# Patient Record
Sex: Male | Born: 1965
Health system: Southern US, Community
[De-identification: ages and names within clinical notes are randomized; demographics above are authoritative.]

## PROBLEM LIST (undated history)

## (undated) DIAGNOSIS — I2510 Atherosclerotic heart disease of native coronary artery without angina pectoris: Secondary | ICD-10-CM

## (undated) DIAGNOSIS — E7800 Pure hypercholesterolemia, unspecified: Secondary | ICD-10-CM

## (undated) HISTORY — DX: Pure hypercholesterolemia, unspecified: E78.00

## (undated) HISTORY — DX: Atherosclerotic heart disease of native coronary artery without angina pectoris: I25.10

---

## 2016-12-10 DIAGNOSIS — Z0000 Encounter for general adult medical examination without abnormal findings: Secondary | ICD-10-CM | POA: Diagnosis not present

## 2016-12-10 DIAGNOSIS — Z136 Encounter for screening for cardiovascular disorders: Secondary | ICD-10-CM | POA: Diagnosis not present

## 2017-04-26 DIAGNOSIS — B349 Viral infection, unspecified: Secondary | ICD-10-CM | POA: Diagnosis not present

## 2017-04-26 DIAGNOSIS — R509 Fever, unspecified: Secondary | ICD-10-CM | POA: Diagnosis not present

## 2018-01-07 DIAGNOSIS — Z136 Encounter for screening for cardiovascular disorders: Secondary | ICD-10-CM | POA: Diagnosis not present

## 2018-01-07 DIAGNOSIS — Z0000 Encounter for general adult medical examination without abnormal findings: Secondary | ICD-10-CM | POA: Diagnosis not present

## 2019-12-28 DIAGNOSIS — H52223 Regular astigmatism, bilateral: Secondary | ICD-10-CM | POA: Diagnosis not present

## 2019-12-28 DIAGNOSIS — H5203 Hypermetropia, bilateral: Secondary | ICD-10-CM | POA: Diagnosis not present

## 2019-12-28 DIAGNOSIS — H524 Presbyopia: Secondary | ICD-10-CM | POA: Diagnosis not present

## 2020-02-02 DIAGNOSIS — Z125 Encounter for screening for malignant neoplasm of prostate: Secondary | ICD-10-CM | POA: Diagnosis not present

## 2020-02-02 DIAGNOSIS — Z0000 Encounter for general adult medical examination without abnormal findings: Secondary | ICD-10-CM | POA: Diagnosis not present

## 2020-02-02 DIAGNOSIS — Z23 Encounter for immunization: Secondary | ICD-10-CM | POA: Diagnosis not present

## 2020-02-02 DIAGNOSIS — R7309 Other abnormal glucose: Secondary | ICD-10-CM | POA: Diagnosis not present

## 2020-02-02 DIAGNOSIS — E7800 Pure hypercholesterolemia, unspecified: Secondary | ICD-10-CM | POA: Diagnosis not present

## 2020-05-06 DIAGNOSIS — Z23 Encounter for immunization: Secondary | ICD-10-CM | POA: Diagnosis not present

## 2020-09-05 ENCOUNTER — Other Ambulatory Visit (HOSPITAL_BASED_OUTPATIENT_CLINIC_OR_DEPARTMENT_OTHER): Payer: Self-pay

## 2020-09-05 DIAGNOSIS — M7522 Bicipital tendinitis, left shoulder: Secondary | ICD-10-CM | POA: Diagnosis not present

## 2020-09-05 MED ORDER — METHYLPREDNISOLONE 4 MG PO TBPK
ORAL_TABLET | ORAL | 0 refills | Status: DC
  Filled 2020-09-05: qty 21, 6d supply, fill #0

## 2020-09-05 MED ORDER — HYDROCODONE-ACETAMINOPHEN 5-325 MG PO TABS
ORAL_TABLET | ORAL | 0 refills | Status: DC
  Filled 2020-09-05: qty 3, 3d supply, fill #0

## 2020-09-06 ENCOUNTER — Other Ambulatory Visit (HOSPITAL_COMMUNITY): Payer: Self-pay

## 2020-09-06 ENCOUNTER — Other Ambulatory Visit (HOSPITAL_BASED_OUTPATIENT_CLINIC_OR_DEPARTMENT_OTHER): Payer: Self-pay

## 2020-12-11 ENCOUNTER — Ambulatory Visit: Payer: Self-pay | Attending: Internal Medicine

## 2020-12-11 DIAGNOSIS — Z23 Encounter for immunization: Secondary | ICD-10-CM

## 2020-12-11 NOTE — Progress Notes (Signed)
   Covid-19 Vaccination Clinic  Name:  Philip Lamb    MRN: [id number] DOB: Nov 06, 1965  12/11/2020  Mr. Philip Lamb was observed post Covid-19 immunization for 15 minutes without incident. He was provided with Vaccine Information Sheet and instruction to access the V-Safe system.   Mr. Philip Lamb was instructed to call 911 with any severe reactions post vaccine: Difficulty breathing  Swelling of face and throat  A fast heartbeat  A bad rash all over body  Dizziness and weakness

## 2021-01-07 ENCOUNTER — Other Ambulatory Visit (HOSPITAL_BASED_OUTPATIENT_CLINIC_OR_DEPARTMENT_OTHER): Payer: Self-pay

## 2021-01-07 MED ORDER — PFIZER COVID-19 VAC BIVALENT 30 MCG/0.3ML IM SUSP
INTRAMUSCULAR | 0 refills | Status: DC
  Filled 2021-01-07: qty 0.3, 1d supply, fill #0

## 2021-02-06 ENCOUNTER — Other Ambulatory Visit: Payer: Self-pay | Admitting: Family Medicine

## 2021-02-06 DIAGNOSIS — E7800 Pure hypercholesterolemia, unspecified: Secondary | ICD-10-CM | POA: Diagnosis not present

## 2021-02-06 DIAGNOSIS — Z13220 Encounter for screening for lipoid disorders: Secondary | ICD-10-CM | POA: Diagnosis not present

## 2021-02-06 DIAGNOSIS — Z131 Encounter for screening for diabetes mellitus: Secondary | ICD-10-CM | POA: Diagnosis not present

## 2021-02-06 DIAGNOSIS — Z125 Encounter for screening for malignant neoplasm of prostate: Secondary | ICD-10-CM | POA: Diagnosis not present

## 2021-02-06 DIAGNOSIS — Z0000 Encounter for general adult medical examination without abnormal findings: Secondary | ICD-10-CM | POA: Diagnosis not present

## 2021-02-06 DIAGNOSIS — Z23 Encounter for immunization: Secondary | ICD-10-CM | POA: Diagnosis not present

## 2021-02-06 DIAGNOSIS — R059 Cough, unspecified: Secondary | ICD-10-CM | POA: Diagnosis not present

## 2021-02-20 DIAGNOSIS — H524 Presbyopia: Secondary | ICD-10-CM | POA: Diagnosis not present

## 2021-02-20 DIAGNOSIS — H5203 Hypermetropia, bilateral: Secondary | ICD-10-CM | POA: Diagnosis not present

## 2021-02-20 DIAGNOSIS — H52223 Regular astigmatism, bilateral: Secondary | ICD-10-CM | POA: Diagnosis not present

## 2021-02-20 DIAGNOSIS — H11043 Peripheral pterygium, stationary, bilateral: Secondary | ICD-10-CM | POA: Diagnosis not present

## 2021-02-20 DIAGNOSIS — H11021 Central pterygium of right eye: Secondary | ICD-10-CM | POA: Diagnosis not present

## 2021-03-14 DIAGNOSIS — R972 Elevated prostate specific antigen [PSA]: Secondary | ICD-10-CM | POA: Diagnosis not present

## 2021-03-17 ENCOUNTER — Other Ambulatory Visit: Payer: Self-pay

## 2021-03-17 ENCOUNTER — Ambulatory Visit
Admission: RE | Admit: 2021-03-17 | Discharge: 2021-03-17 | Disposition: A | Discharge status: Home / Self Care | Payer: No Typology Code available for payment source | Source: Ambulatory Visit | Attending: Family Medicine | Admitting: Family Medicine

## 2021-03-17 DIAGNOSIS — E7800 Pure hypercholesterolemia, unspecified: Secondary | ICD-10-CM

## 2021-04-15 ENCOUNTER — Other Ambulatory Visit (HOSPITAL_BASED_OUTPATIENT_CLINIC_OR_DEPARTMENT_OTHER): Payer: Self-pay

## 2021-04-15 MED ORDER — ROSUVASTATIN CALCIUM 10 MG PO TABS
ORAL_TABLET | ORAL | 11 refills | Status: DC
  Filled 2021-04-15: qty 30, 30d supply, fill #0

## 2021-05-07 NOTE — Progress Notes (Signed)
?Cardiology Office Note:   ? ?Date:  05/08/2021  ? ?IDJatniel Lamb, DOB 1966-02-08, MRN [id number] ? ?PCP:  Darrow Bussing, MD  ? ?[facility] Providers ?Cardiologist:  Alverda Skeans, MD ?Referring MD: Darrow Bussing, MD  ? ?Chief Complaint/Reason for Referral: Elevated calcium score ? ?ASSESSMENT:   ? ?1. Coronary artery calcification seen on CAT scan   ?2. Hyperlipidemia, unspecified hyperlipidemia type   ? ? ?PLAN:   ? ?In order of problems listed above: ?1.  He is having no signs or symptoms of angina.  Start aspirin and increase Crestor to 20 mg.  Goal LDL is less than 70.  We will check lipid panel, LFTs, and LP(a) in 2 months.  Follow-up in 1 year or earlier if needed. ?2.  See discussion above. ?  ? ?Dispo:  Return in about 1 year (around 05/09/2022).  ? ?  ? ?Medication Adjustments/Labs and Tests Ordered: ?Current medicines are reviewed at length with the patient today.  Concerns regarding medicines are outlined above.  ? ?Tests Ordered: ?No orders of the defined types were placed in this encounter. ? ? ?Medication Changes: ?No orders of the defined types were placed in this encounter. ? ? ?History of Present Illness:   ? ?FOCUSED PROBLEM LIST:   ?1.  Elevated calcium score with multivessel calcification 2023 ?2.  Hyperlipidemia ? ?The patient is a 56 y.o. male with the indicated medical history here for recommendations regarding elevated calcium score on CT.  The patient was seen by his primary care provider recently and a CT scan was ordered for prognostic purposes.  The patient is doing well.  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  He is exercising regularly.  He denies any exertional angina, shortness of breath, presyncope, or syncope.  He did have limited palpitations about a month and a half ago when he heard the news about his CT scan but has had none since then.  He denies any severe bleeding or bruising.  He is required no hospitalizations or emergency room visits recently for any  reason. ? ?He does not smoke or drink currently. ? ?He denies a family history of coronary artery disease in any first-degree family members. ? ?   ?  ?Previous Medical History: ?Past Medical History:  ?Diagnosis Date  ? CAD (coronary artery disease)   ? Hypercholesteremia   ? ? ? ?Current Medications: ?Current Meds  ?Medication Sig  ? COVID-19 mRNA bivalent vaccine, Pfizer, (PFIZER COVID-19 VAC BIVALENT) injection Inject into the muscle.  ? Multiple Vitamin (MULTIVITAMIN) tablet Take 1 tablet by mouth daily.  ? Omega-3 Fatty Acids (FISH OIL) 500 MG CAPS Take by mouth.  ? vitamin C (ASCORBIC ACID) 500 MG tablet Take 500 mg by mouth daily.  ? [DISCONTINUED] rosuvastatin (CRESTOR) 10 MG tablet Take 1 tablet by mouth once a day  ?  ? ?Allergies:    ?Penicillins  ? ?Social History:   ?Social History  ? ?Tobacco Use  ? Smoking status: Some Days  ?  Types: Cigarettes  ? Smokeless tobacco: Never  ? Tobacco comments:  ?  Patient states that he was a social smoker in college  ?  ? ?Family Hx: ?History reviewed. No pertinent family history.  ? ?Review of Systems:   ?Please see the history of present illness.    ?All other systems reviewed and are negative. ?  ? ? ?EKGs/Labs/Other Test Reviewed:   ? ?EKG: Today's EKG sinus rhythm ? ?Prior CV studies: ?CT  2023 ?1. Coronary calcium score 338 is at the 95th percentile for the ?patient's sex, age and race.  Calcium seen in the left main, LAD, left circumflex, and right coronary artery. ?2. Evidence of prior granulomatous disease with calcified ?granulomata in the right lung. ? ?Imaging studies that I have independently reviewed today: Calcium score ? ?Recent Labs: ?External labs December 2022 demonstrated sodium 140 glucose 99 creatinine 0.75 potassium 3.9 LFTs within normal limits cholesterol 214 triglycerides 94, HDL 61, LDL 136 ? ?Risk Assessment/Calculations:   ? ? ?    ? ?Physical Exam:   ? ?VS:  BP 122/62   Pulse 62   Ht 5\' 6"  (1.676 m)   Wt 152 lb 3.2 oz (69 kg)   SpO2  98%   BMI 24.57 kg/m?    ?Wt Readings from Last 3 Encounters:  ?05/08/21 152 lb 3.2 oz (69 kg)  ?  ?GENERAL:  No apparent distress, AOx3 ?HEENT:  No carotid bruits, +2 carotid impulses, no scleral icterus ?CAR: RRR  no murmurs, gallops, rubs, or thrills ?RES:  Clear to auscultation bilaterally ?ABD:  Soft, nontender, nondistended, positive bowel sounds x 4 ?VASC:  +2 radial pulses, +2 carotid pulses, palpable pedal pulses ?NEURO:  CN 2-12 grossly intact; motor and sensory grossly intact ?PSYCH:  No active depression or anxiety ?EXT:  No edema, ecchymosis, or cyanosis ? ?Signed, ?05/10/21, MD  ?05/08/2021 9:51 AM    ?[facility] ?[address] [place], [place], [place]  [place] ?Phone: [phone]; Fax: [phone]  ? ?Note:  This document was prepared using Dragon voice recognition software and may include unintentional dictation errors. ?

## 2021-05-08 ENCOUNTER — Other Ambulatory Visit: Payer: Self-pay

## 2021-05-08 ENCOUNTER — Other Ambulatory Visit (HOSPITAL_BASED_OUTPATIENT_CLINIC_OR_DEPARTMENT_OTHER): Payer: Self-pay

## 2021-05-08 ENCOUNTER — Ambulatory Visit (INDEPENDENT_AMBULATORY_CARE_PROVIDER_SITE_OTHER): Payer: 59 | Admitting: Internal Medicine

## 2021-05-08 ENCOUNTER — Encounter: Payer: Self-pay | Admitting: Internal Medicine

## 2021-05-08 VITALS — BP 122/62 | HR 62 | Ht 66.0 in | Wt 152.2 lb

## 2021-05-08 DIAGNOSIS — E785 Hyperlipidemia, unspecified: Secondary | ICD-10-CM | POA: Diagnosis not present

## 2021-05-08 DIAGNOSIS — I2510 Atherosclerotic heart disease of native coronary artery without angina pectoris: Secondary | ICD-10-CM | POA: Diagnosis not present

## 2021-05-08 MED ORDER — ROSUVASTATIN CALCIUM 20 MG PO TABS
20.0000 mg | ORAL_TABLET | Freq: Every day | ORAL | 3 refills | Status: DC
  Filled 2021-05-08: qty 90, 90d supply, fill #0
  Filled 2021-08-07: qty 90, 90d supply, fill #1
  Filled 2021-11-04: qty 90, 90d supply, fill #2
  Filled 2022-02-01: qty 90, 90d supply, fill #3

## 2021-05-08 MED ORDER — ASPIRIN EC 81 MG PO TBEC: 81.0000 mg | DELAYED_RELEASE_TABLET | Freq: Every day | ORAL | 3 refills | Status: AC

## 2021-05-08 NOTE — Patient Instructions (Signed)
Medication Instructions:  ?Your physician has recommended you make the following change in your medication:  ?1.) increase rosuvastatin (Crestor) to 20 mg daily ?2.) start aspirin 81 mg daily ? ?*If you need a refill on your cardiac medications before your next appointment, please call your pharmacy* ? ? ?Lab Work: ?Please return in 2 months for labs (lipids/liver/lipo a) ? ?If you have labs (blood work) drawn today and your tests are completely normal, you will receive your results only by: ?MyChart Message (if you have MyChart) OR ?A paper copy in the mail ?If you have any lab test that is abnormal or we need to change your treatment, we will call you to review the results. ? ? ?Testing/Procedures: ?none ? ? ?Follow-Up: ?At [facility], you and your health needs are our priority.  As part of our continuing mission to provide you with exceptional heart care, we have created designated Provider Care Teams.  These Care Teams include your primary Cardiologist (physician) and Advanced Practice Providers (APPs -  Physician Assistants and Nurse Practitioners) who all work together to provide you with the care you need, when you need it. ? ?We recommend signing up for the patient portal called "MyChart".  Sign up information is provided on this After Visit Summary.  MyChart is used to connect with patients for Virtual Visits (Telemedicine).  Patients are able to view lab/test results, encounter notes, upcoming appointments, etc.  Non-urgent messages can be sent to your provider as well.   ?To learn more about what you can do with MyChart, go to [email or web address].   ? ?Your next appointment:   ?12 month(s) ? ?The format for your next appointment:   ?In Person ? ?Provider:   ?Alverda Skeans, MD ? ?Other Instructions ?  ?

## 2021-07-11 ENCOUNTER — Other Ambulatory Visit: Payer: 59

## 2021-07-18 ENCOUNTER — Other Ambulatory Visit: Payer: Commercial Managed Care - PPO | Admitting: *Deleted

## 2021-07-18 DIAGNOSIS — I2510 Atherosclerotic heart disease of native coronary artery without angina pectoris: Secondary | ICD-10-CM | POA: Diagnosis not present

## 2021-07-18 DIAGNOSIS — E785 Hyperlipidemia, unspecified: Secondary | ICD-10-CM

## 2021-07-22 LAB — LIPID PANEL
Chol/HDL Ratio: 1.8 ratio (ref 0.0–5.0)
Cholesterol, Total: 133 mg/dL (ref 100–199)
HDL: 73 mg/dL (ref >39)
LDL Chol Calc (NIH): 43 mg/dL (ref 0–99)
Triglycerides: 93 mg/dL (ref 0–149)
VLDL Cholesterol Cal: 17 mg/dL (ref 5–40)

## 2021-07-22 LAB — HEPATIC FUNCTION PANEL
ALT: 28 IU/L (ref 0–44)
AST: 23 IU/L (ref 0–40)
Albumin: 4.7 g/dL (ref 3.8–4.9)
Alkaline Phosphatase: 65 IU/L (ref 44–121)
Bilirubin Total: 0.7 mg/dL (ref 0.0–1.2)
Bilirubin, Direct: 0.23 mg/dL (ref 0.00–0.40)
Total Protein: 7.4 g/dL (ref 6.0–8.5)

## 2021-07-22 LAB — LIPOPROTEIN A (LPA): Lipoprotein (a): 34.3 nmol/L (ref <75.0)

## 2021-07-23 ENCOUNTER — Telehealth: Payer: Self-pay | Admitting: Internal Medicine

## 2021-07-23 NOTE — Telephone Encounter (Signed)
Orbie Pyo, MD  07/22/2021 10:36 AM EDT     Let him know lipids are very good, cont current meds    The patient has been notified of the result and verbalized understanding.  All questions (if any) were answered. Loa Socks, LPN 07/03/2583 2:77 PM

## 2021-07-23 NOTE — Telephone Encounter (Signed)
Patient returned call for his lab results.  

## 2021-08-07 ENCOUNTER — Other Ambulatory Visit (HOSPITAL_BASED_OUTPATIENT_CLINIC_OR_DEPARTMENT_OTHER): Payer: Self-pay

## 2021-09-11 DIAGNOSIS — R972 Elevated prostate specific antigen [PSA]: Secondary | ICD-10-CM | POA: Diagnosis not present

## 2021-11-04 ENCOUNTER — Other Ambulatory Visit (HOSPITAL_BASED_OUTPATIENT_CLINIC_OR_DEPARTMENT_OTHER): Payer: Self-pay

## 2022-02-12 DIAGNOSIS — Z125 Encounter for screening for malignant neoplasm of prostate: Secondary | ICD-10-CM | POA: Diagnosis not present

## 2022-02-12 DIAGNOSIS — E7800 Pure hypercholesterolemia, unspecified: Secondary | ICD-10-CM | POA: Diagnosis not present

## 2022-02-12 DIAGNOSIS — Z0000 Encounter for general adult medical examination without abnormal findings: Secondary | ICD-10-CM | POA: Diagnosis not present

## 2022-02-12 DIAGNOSIS — Z131 Encounter for screening for diabetes mellitus: Secondary | ICD-10-CM | POA: Diagnosis not present

## 2022-02-12 DIAGNOSIS — I2510 Atherosclerotic heart disease of native coronary artery without angina pectoris: Secondary | ICD-10-CM | POA: Diagnosis not present

## 2022-02-20 DIAGNOSIS — H5203 Hypermetropia, bilateral: Secondary | ICD-10-CM | POA: Diagnosis not present

## 2022-02-20 DIAGNOSIS — H524 Presbyopia: Secondary | ICD-10-CM | POA: Diagnosis not present

## 2022-02-20 DIAGNOSIS — H52223 Regular astigmatism, bilateral: Secondary | ICD-10-CM | POA: Diagnosis not present

## 2022-03-23 IMAGING — CT CT CARDIAC CORONARY ARTERY CALCIUM SCORE
3 series · 13 of 20 positions shown, 15 images · non-contrast
Comparison: None.

CLINICAL DATA: 55-year-old Asian male with history of
hyperlipidemia.

EXAM:
CT CARDIAC CORONARY ARTERY CALCIUM SCORE
TECHNIQUE: Non-contrast imaging through the heart was performed using
prospective ECG gating. Image post processing was performed on an
independent workstation, allowing for quantitative analysis of the
heart and coronary arteries. Note that this exam targets the heart
and the chest was not imaged in its entirety.

[Series 2: calcium scoring 2.00 qr36 bestdiast 71% hrt calciu · axial · 0.39mm/px · z∈[+1586,+1650]mm · 3 of 80 slices shown]
[im 16/80  vessel]
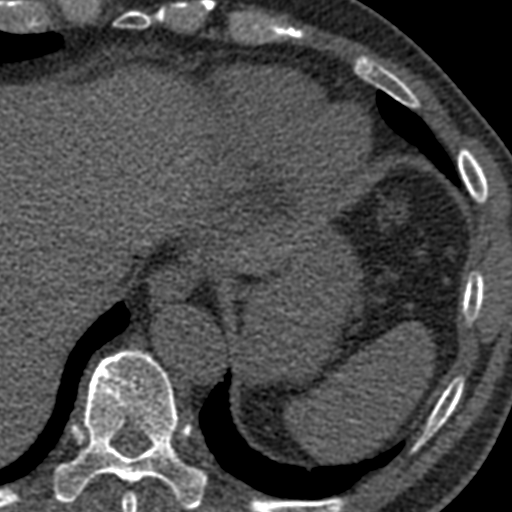
[im 32/80  vessel]
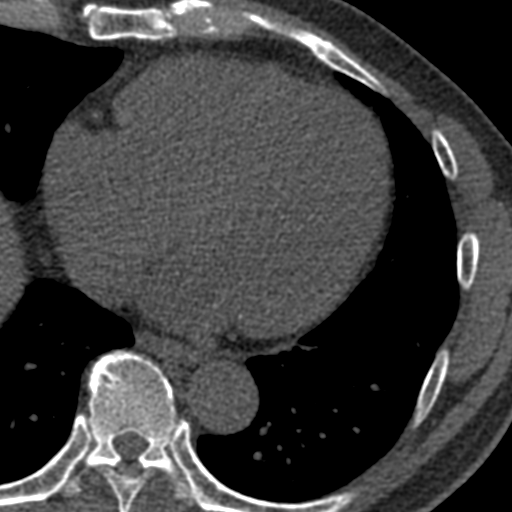
[im 48/80  vessel]
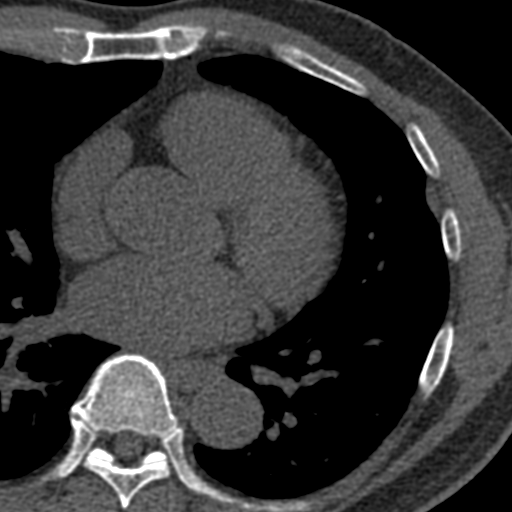

[Series 3: calcium scoring 2.00 br40 bestdiast 71% axial · axial · 0.54mm/px · z∈[+1582,+1686]mm · 5 of 80 slices shown, 7 images]
[im 14/80  vessel]
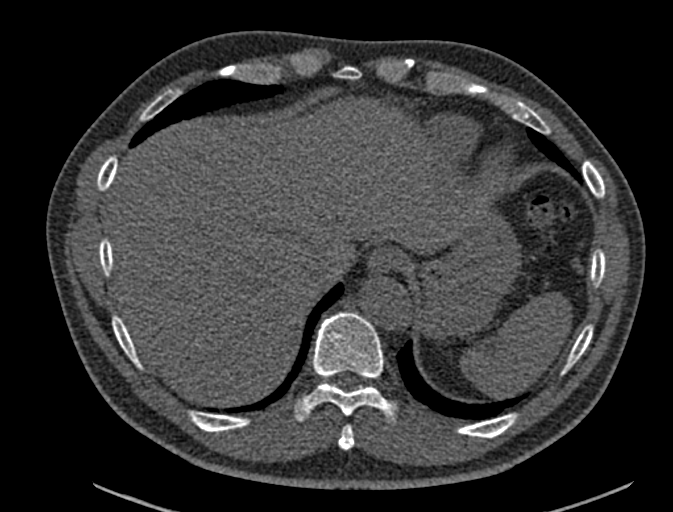
[im 14/80  lung]
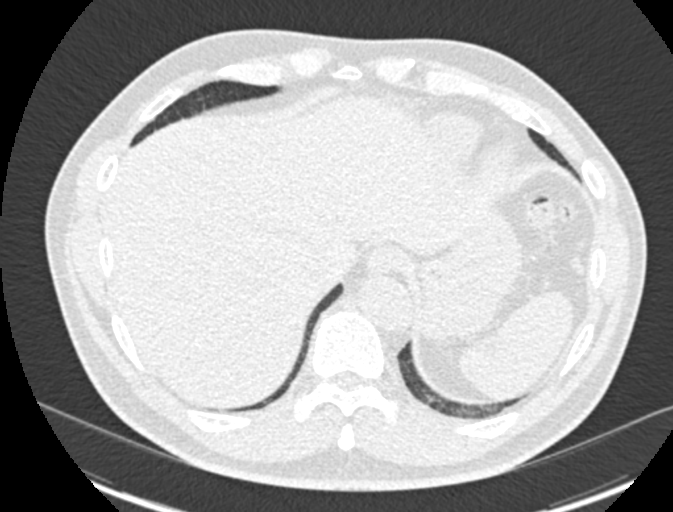
[im 27/80  vessel]
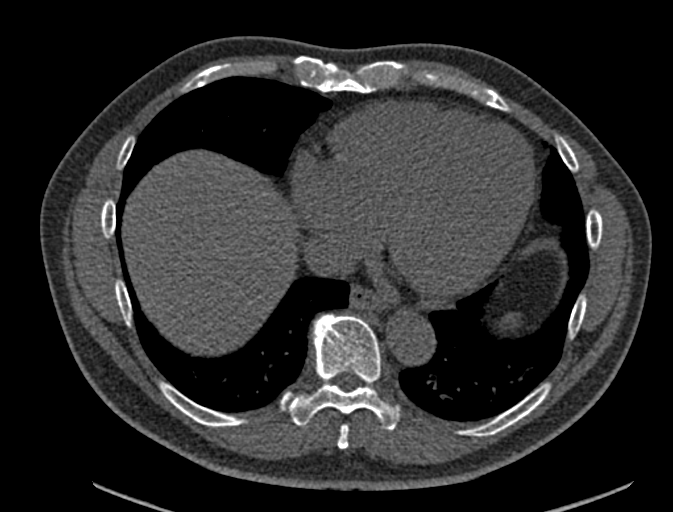
[im 40/80  vessel]
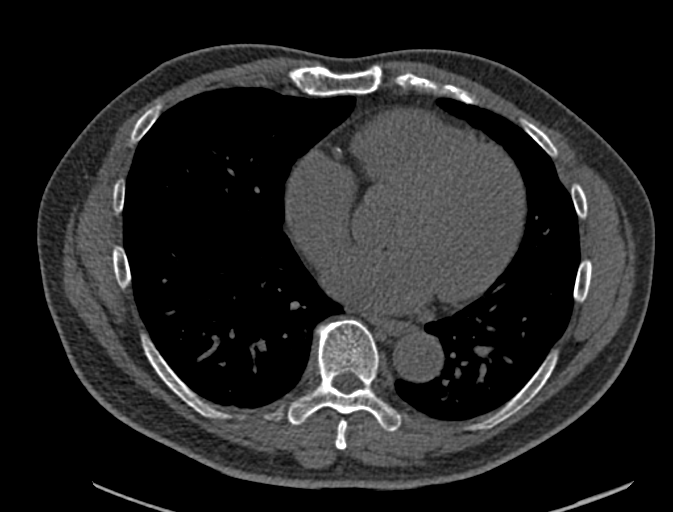
[im 53/80  vessel]
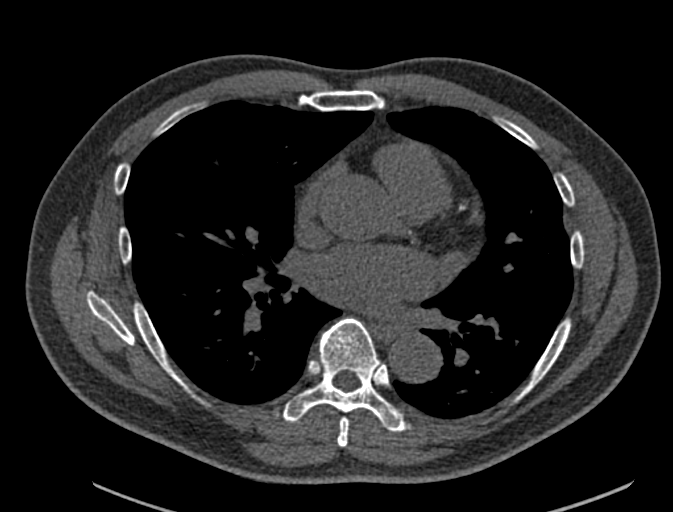
[im 66/80  vessel]
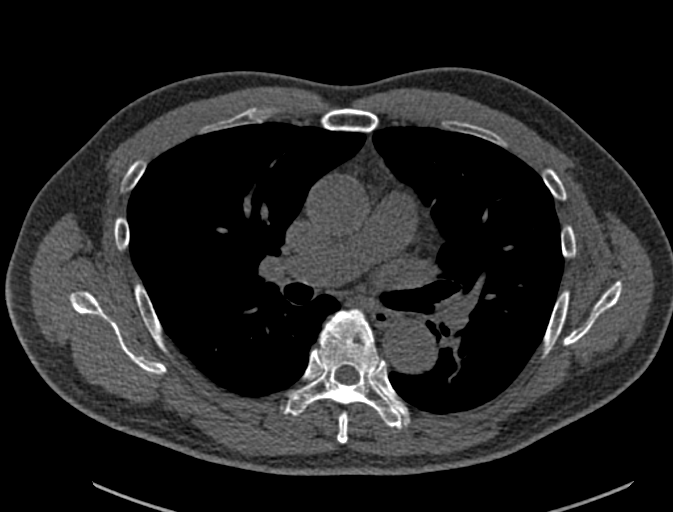
[im 66/80  lung]
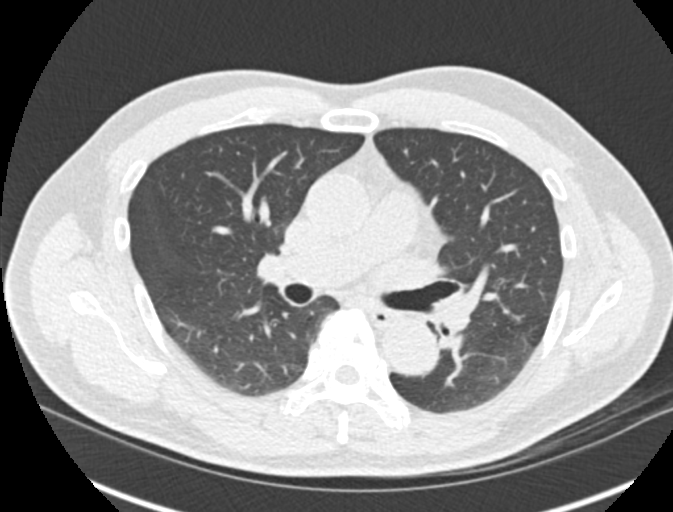

[Series 9: calcium scoring 2.00 br60 bestdiast 71% lungs · axial · 0.54mm/px · z∈[+1582,+1686]mm · 5 of 80 slices shown]
[im 14/80  vessel]
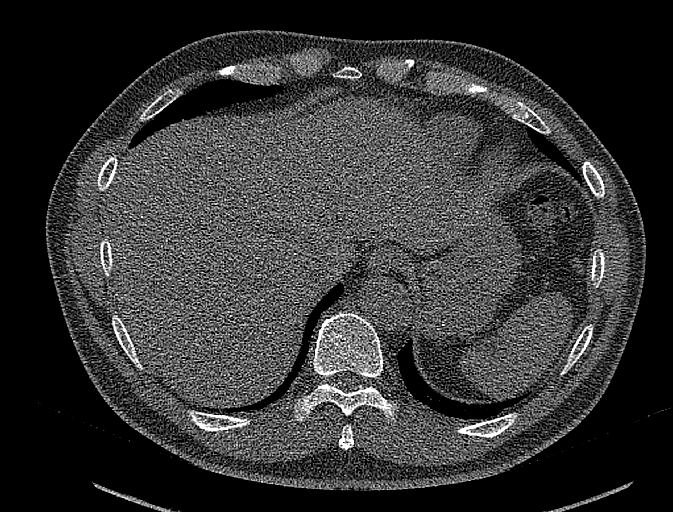
[im 27/80  vessel]
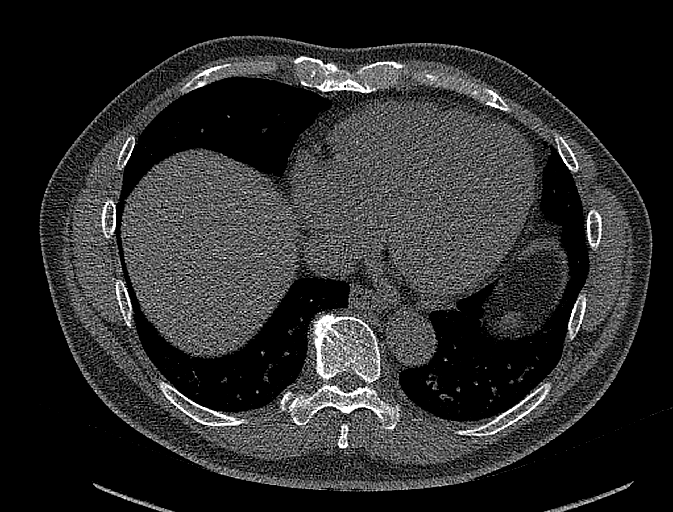
[im 40/80  vessel]
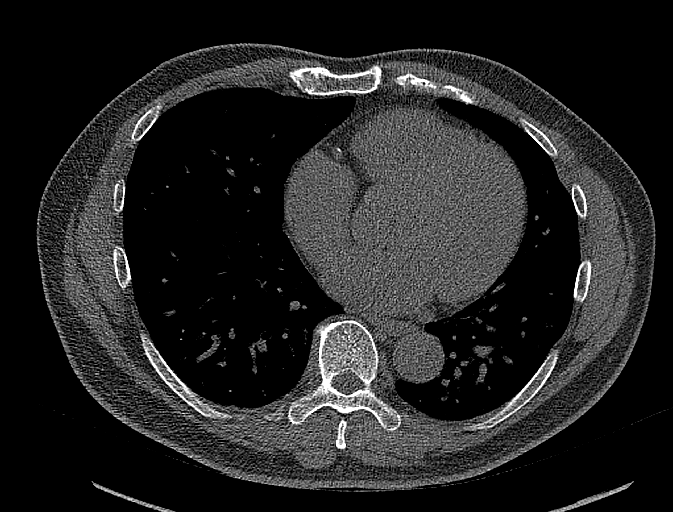
[im 53/80  vessel]
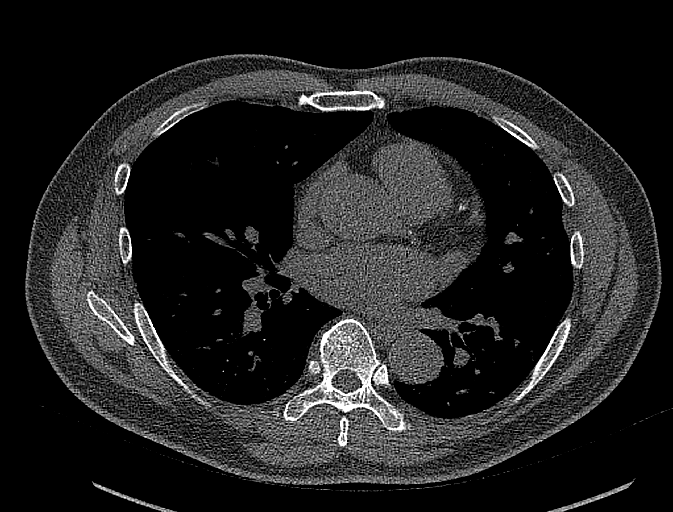
[im 66/80  vessel]
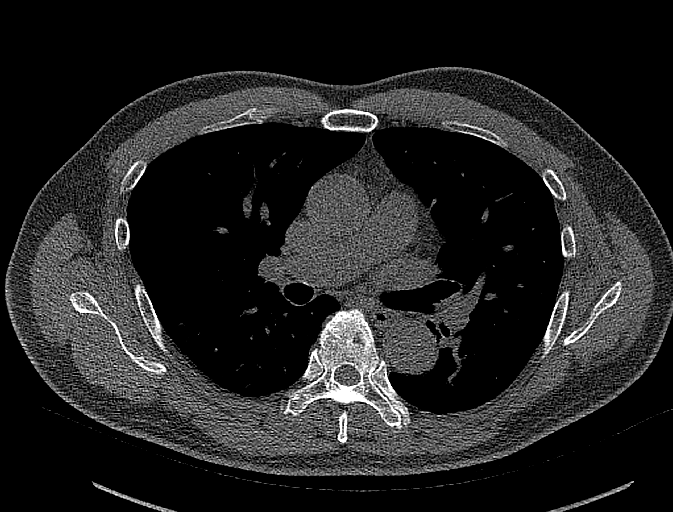

[13 of 20 positions shown; findings below may reference images not displayed]

FINDINGS: CORONARY CALCIUM SCORES:

Left Main:

LAD: 265

LCx:

RCA:

Total Agatston Score: 338

[HOSPITAL] percentile: 95

AORTA MEASUREMENTS:

Ascending Aorta: 35 mm

Descending Aorta: 27 mm

OTHER FINDINGS:

The heart size is within normal limits. No pericardial fluid is
identified. Visualized segments of the thoracic aorta and central
pulmonary arteries are normal in caliber. Visualized mediastinum and
hilar regions demonstrate no lymphadenopathy or masses. There are
calcified granulomata in the anterior right upper lobe and right
lower lobe. Visualized lungs show no evidence of pulmonary edema,
consolidation, pneumothorax or pleural fluid. Visualized upper
abdomen and bony structures are unremarkable.
IMPRESSION: 1. Coronary calcium score 338 is at the 95th percentile for the
patient's sex, age and race.
2. Evidence of prior granulomatous disease with calcified
granulomata in the right lung.

## 2022-06-10 ENCOUNTER — Other Ambulatory Visit (HOSPITAL_BASED_OUTPATIENT_CLINIC_OR_DEPARTMENT_OTHER): Payer: Self-pay

## 2022-06-10 ENCOUNTER — Other Ambulatory Visit: Payer: Self-pay | Admitting: Internal Medicine

## 2022-06-10 MED ORDER — ROSUVASTATIN CALCIUM 20 MG PO TABS
20.0000 mg | ORAL_TABLET | Freq: Every day | ORAL | 0 refills | Status: DC
Start: 2022-06-10 — End: 2022-10-07
  Filled 2022-06-10: qty 90, 90d supply, fill #0

## 2022-06-17 NOTE — Progress Notes (Deleted)
   Office Visit    Patient Name: Philip Lamb Date of Encounter: 06/17/2022  PCP:  Darrow Bussing, MD   [facility]  Cardiologist:  Orbie Pyo, MD  Advanced Practice Provider:  No care team member to display Electrophysiologist:  None   HPI    Philip Lamb is a 57 y.o. male with a PMH of hyperlipidemia and CAD presents today for follow-up appointment.  Patient was seen by his primary care provider and had a CT scan ordered for present stick purposes.  Patient was doing well at that time.  He works as a Optometrist at [employer].  Exercises regularly.  Denied any exertional angina, shortness of breath, presyncope, or syncope.  Did have a limited bout of palpitations about a month prior to his last appointment (05/08/2021).  Another bout when he heard about his CT scan results but none since then.  No severe bleeding or bruising.  No hospitalizations.  Does not drink or smoke.  Denied family history of CAD or any first-degree family members.  Today, he***  Past Medical History    Past Medical History:  Diagnosis Date   CAD (coronary artery disease)    Hypercholesteremia    No past surgical history on file.  Allergies  Allergies  Allergen Reactions   Penicillins Hives    EKGs/Labs/Other Studies Reviewed:   The following studies were reviewed today:  Coronary CT scan 03/17/21     IMPRESSION: 1. Coronary calcium score 338 is at the 95th percentile for the patient's sex, age and race. 2. Evidence of prior granulomatous disease with calcified granulomata in the right lung.  EKG:  EKG is *** ordered today.  The ekg ordered today demonstrates ***  Recent Labs: 07/18/2021: ALT 28  Recent Lipid Panel    Component Value Date/Time   CHOL 133 07/18/2021 0753   TRIG 93 07/18/2021 0753   HDL 73 07/18/2021 0753   CHOLHDL 1.8 07/18/2021 0753   LDLCALC 43 07/18/2021 0753    Risk Assessment/Calculations:  {Does this patient have ATRIAL  FIBRILLATION?:[phone]}  Home Medications   No outpatient medications have been marked as taking for the 06/18/22 encounter (Appointment) with Sharlene Dory, PA-C.     Review of Systems   ***   All other systems reviewed and are otherwise negative except as noted above.  Physical Exam    VS:  There were no vitals taken for this visit. , BMI There is no height or weight on file to calculate BMI.  Wt Readings from Last 3 Encounters:  05/08/21 152 lb 3.2 oz (69 kg)     GEN: Well nourished, well developed, in no acute distress. HEENT: normal. Neck: Supple, no JVD, carotid bruits, or masses. Cardiac: ***RRR, no murmurs, rubs, or gallops. No clubbing, cyanosis, edema.  ***Radials/PT 2+ and equal bilaterally.  Respiratory:  ***Respirations regular and unlabored, clear to auscultation bilaterally. GI: Soft, nontender, nondistended. MS: No deformity or atrophy. Skin: Warm and dry, no rash. Neuro:  Strength and sensation are intact. Psych: Normal affect.  Assessment & Plan    CAD Hyperlipidemia  No BP recorded.  {Refresh Note OR Click here to enter BP  :1}***      Disposition: Follow up {follow up:15908} with Orbie Pyo, MD or APP.  Signed, Sharlene Dory, PA-C 06/17/2022, 1:17 PM [facility]

## 2022-06-18 ENCOUNTER — Ambulatory Visit: Payer: Self-pay | Admitting: Physician Assistant

## 2022-06-18 DIAGNOSIS — I2510 Atherosclerotic heart disease of native coronary artery without angina pectoris: Secondary | ICD-10-CM

## 2022-06-18 DIAGNOSIS — E785 Hyperlipidemia, unspecified: Secondary | ICD-10-CM

## 2022-06-19 NOTE — Progress Notes (Unsigned)
Cardiology Office Note:    Date:  06/25/2022   ID:  Philip Lamb, DOB [birth year], MRN [id number]  PCP:  Philip Bussing, MD   [facility] Providers Cardiologist:  Philip Pyo, MD     Referring MD: Philip Bussing, MD   Chief Complaint: follow up elevated calcium score  History of Present Illness:    Philip Lamb is a pleasant 57 y.o. male with a hx of coronary artery calcification seen on CT scan and hyperlipidemia.   Referred to cardiology and seen by Dr. Lynnette Lamb on 05/08/21 for elevated calcium score on CT. patient was seen by PCP and CT was ordered for prognostic purposes. Coronary calcium score 338 (95th percentile for age/sex/race) with calcium seen in the left main, LAD, left circumflex, and right coronary arteries.  Also evidence of prior granulomatous  disease with calcified granuloma in right lung. 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  At the time of the visit he was having no concerning symptoms.  No coronary artery disease in any first-degree family members.  He was advised to increase rosuvastatin to 20 mg daily and start aspirin 81 mg daily.  Follow-up labs were completed on 07/18/2021 and revealed lipoprotein a 34, LDL 43, trigs 93, HDL 73. He was advised to return in 1 year for follow-up.  Today, he is here alone for follow-up.  Reports he has been feeling well with no specific cardiac concerns.  Works out on a consistent basis at [employer], walks on treadmill > 30 min as well as weightlifting. Feels muscle spasms at times. 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 Typically only eats one meal during shifts, admits he probably eats too much when he gets home. Eats a variety of foods, no soda, occasional red meat, low sugar intake. He denies chest pain, shortness of breath, lower extremity edema, fatigue, palpitations, melena, hematuria, hemoptysis, diaphoresis, weakness, presyncope, syncope, orthopnea, and PND. He asks about aspirin and statin in  regards to dosing and duration.   Past Medical History:  Diagnosis Date   CAD (coronary artery disease)    Hypercholesteremia     History reviewed. No pertinent surgical history.  Current Medications: Current Meds  Medication Sig   aspirin EC 81 MG tablet Take 1 tablet (81 mg total) by mouth daily. Swallow whole.   Multiple Vitamin (MULTIVITAMIN) tablet Take 1 tablet by mouth daily.   Omega-3 Fatty Acids (FISH OIL) 500 MG CAPS Take by mouth.   rosuvastatin (CRESTOR) 20 MG tablet Take 1 tablet (20 mg total) by mouth daily.   vitamin C (ASCORBIC ACID) 500 MG tablet Take 500 mg by mouth daily.     Allergies:   Penicillins   Social History   Socioeconomic History   Marital status: Married    Spouse name: Not on file   Number of children: Not on file   Years of education: Not on file   Highest education level: Not on file  Occupational History   Not on file  Tobacco Use   Smoking status: Some Days    Types: Cigarettes   Smokeless tobacco: Never   Tobacco comments:    Patient states that he was a social smoker in college  Substance and Sexual Activity   Alcohol use: Not on file   Drug use: Not on file   Sexual activity: Not on file  Other Topics Concern   Not on file  Social History Narrative   Not on file  Social Determinants of Health   Financial Resource Strain: Not on file  Food Insecurity: Not on file  Transportation Needs: Not on file  Physical Activity: Not on file  Stress: Not on file  Social Connections: Not on file     Family History: The patient's family history is not on file.  ROS:   Please see the history of present illness.   All other systems reviewed and are negative.  Labs/Other Studies Reviewed:    The following studies were reviewed today:  CT Cardiac Score 03/18/21 IMPRESSION: 1. Coronary calcium score 338 is at the 95th percentile for the patient's sex, age and race. 2. Evidence of prior granulomatous disease with  calcified granulomata in the right lung.  Recent Labs: 07/18/2021: ALT 28  Recent Lipid Panel    Component Value Date/Time   CHOL 133 07/18/2021 0753   TRIG 93 07/18/2021 0753   HDL 73 07/18/2021 0753   CHOLHDL 1.8 07/18/2021 0753   LDLCALC 43 07/18/2021 0753     Risk Assessment/Calculations:           Physical Exam:    VS:  BP 127/85   Pulse 69   Ht 5\' 6"  (1.676 m)   Wt 149 lb 9.6 oz (67.9 kg)   SpO2 96%   BMI 24.15 kg/m     Wt Readings from Last 3 Encounters:  06/25/22 149 lb 9.6 oz (67.9 kg)  05/08/21 152 lb 3.2 oz (69 kg)     GEN:  Well nourished, well developed in no acute distress HEENT: Normal NECK: No JVD; No carotid bruits CARDIAC: RRR, no murmurs, rubs, gallops RESPIRATORY:  Clear to auscultation without rales, wheezing or rhonchi  ABDOMEN: Soft, non-tender, non-distended. + bowel sounds MUSCULOSKELETAL:  No edema; No deformity. 2+ pedal pulses, equal bilaterally SKIN: Warm and dry NEUROLOGIC:  Alert and oriented x 3 PSYCHIATRIC:  Normal affect   EKG:  EKG is ordered today.  The ekg ordered today demonstrates normal sinus rhythm at 69 bpm, no ST abnormality  Diagnoses:    1. Coronary artery calcification seen on CAT scan   2. Hyperlipidemia LDL goal <70    Assessment and Plan:     Coronary artery calcification seen on CT scan: CAC score 338 (95th percentile) on CT cardiac score 03/18/21. He remains active with regular exercise and denies chest pain, dyspnea, or other symptoms concerning for angina. No indication for further ischemic evaluation at this time. Continue secondary prevention with heart healthy, mostly plant-based diet, 150 minutes of moderate intensity exercise each week, aspirin, and statin. No bleeding concerns on asa.   Hyperlipidemia LDL goal <70: Normal Lp(a).  LDL 48 on 02/12/2022. He asks about lowering dose of rosuvastatin. He is not having any specific side effects. Lengthy discussion about the benefit of low LDL and the  continued dose of rosuvastatin, along with continued regular exercise and heart healthy, mostly plant-based diet.     Disposition: 1 year with Dr. Lynnette Lamb or APP  Medication Adjustments/Labs and Tests Ordered: Current medicines are reviewed at length with the patient today.  Concerns regarding medicines are outlined above.  Orders Placed This Encounter  Procedures   EKG 12-Lead   No orders of the defined types were placed in this encounter.   Patient Instructions  Medication Instructions:  Your physician recommends that you continue on your current medications as directed. Please refer to the Current Medication list given to you today.  *If you need a refill on your cardiac medications before your  next appointment, please call your pharmacy*   Follow-Up: At [facility], you and your health needs are our priority.  As part of our continuing mission to provide you with exceptional heart care, we have created designated Provider Care Teams.  These Care Teams include your primary Cardiologist (physician) and Advanced Practice Providers (APPs -  Physician Assistants and Nurse Practitioners) who all work together to provide you with the care you need, when you need it.   Your next appointment:   1 year(s)  Provider:   Orbie Pyo, MD        Signed, Levi Aland, NP  06/25/2022 11:56 AM    [facility]

## 2022-06-25 ENCOUNTER — Encounter: Payer: Self-pay | Admitting: Nurse Practitioner

## 2022-06-25 ENCOUNTER — Ambulatory Visit: Payer: 59 | Attending: Nurse Practitioner | Admitting: Nurse Practitioner

## 2022-06-25 VITALS — BP 127/85 | HR 69 | Ht 66.0 in | Wt 149.6 lb

## 2022-06-25 DIAGNOSIS — I2510 Atherosclerotic heart disease of native coronary artery without angina pectoris: Secondary | ICD-10-CM | POA: Diagnosis not present

## 2022-06-25 DIAGNOSIS — E785 Hyperlipidemia, unspecified: Secondary | ICD-10-CM | POA: Diagnosis not present

## 2022-06-25 NOTE — Patient Instructions (Signed)
Medication Instructions:  Your physician recommends that you continue on your current medications as directed. Please refer to the Current Medication list given to you today.  *If you need a refill on your cardiac medications before your next appointment, please call your pharmacy*   Follow-Up: At [facility], you and your health needs are our priority.  As part of our continuing mission to provide you with exceptional heart care, we have created designated Provider Care Teams.  These Care Teams include your primary Cardiologist (physician) and Advanced Practice Providers (APPs -  Physician Assistants and Nurse Practitioners) who all work together to provide you with the care you need, when you need it.   Your next appointment:   1 year(s)  Provider:   Arun K Thukkani, MD      

## 2022-10-07 ENCOUNTER — Other Ambulatory Visit: Payer: Self-pay | Admitting: Internal Medicine

## 2022-10-08 ENCOUNTER — Other Ambulatory Visit (HOSPITAL_BASED_OUTPATIENT_CLINIC_OR_DEPARTMENT_OTHER): Payer: Self-pay

## 2022-10-08 MED ORDER — ROSUVASTATIN CALCIUM 20 MG PO TABS
20.0000 mg | ORAL_TABLET | Freq: Every day | ORAL | 3 refills | Status: DC
  Filled 2022-10-08: qty 90, 90d supply, fill #0
  Filled 2023-01-12: qty 90, 90d supply, fill #1
  Filled 2023-04-19: qty 90, 90d supply, fill #2

## 2022-12-23 ENCOUNTER — Other Ambulatory Visit (HOSPITAL_BASED_OUTPATIENT_CLINIC_OR_DEPARTMENT_OTHER): Payer: Self-pay

## 2022-12-23 DIAGNOSIS — Z23 Encounter for immunization: Secondary | ICD-10-CM | POA: Diagnosis not present

## 2022-12-23 DIAGNOSIS — E7800 Pure hypercholesterolemia, unspecified: Secondary | ICD-10-CM | POA: Diagnosis not present

## 2022-12-23 MED ORDER — VIVOTIF PO CPDR
DELAYED_RELEASE_CAPSULE | ORAL | 0 refills | Status: AC
  Filled 2022-12-23: qty 4, 7d supply, fill #0

## 2022-12-24 ENCOUNTER — Other Ambulatory Visit (HOSPITAL_BASED_OUTPATIENT_CLINIC_OR_DEPARTMENT_OTHER): Payer: Self-pay

## 2022-12-24 MED ORDER — AZITHROMYCIN 250 MG PO TABS
ORAL_TABLET | ORAL | 0 refills | Status: AC
  Filled 2022-12-24: qty 6, 5d supply, fill #0

## 2023-02-22 DIAGNOSIS — E7800 Pure hypercholesterolemia, unspecified: Secondary | ICD-10-CM | POA: Diagnosis not present

## 2023-02-22 DIAGNOSIS — Z0000 Encounter for general adult medical examination without abnormal findings: Secondary | ICD-10-CM | POA: Diagnosis not present

## 2023-02-23 DIAGNOSIS — Z131 Encounter for screening for diabetes mellitus: Secondary | ICD-10-CM | POA: Diagnosis not present

## 2023-02-23 DIAGNOSIS — Z125 Encounter for screening for malignant neoplasm of prostate: Secondary | ICD-10-CM | POA: Diagnosis not present

## 2023-02-23 DIAGNOSIS — E7800 Pure hypercholesterolemia, unspecified: Secondary | ICD-10-CM | POA: Diagnosis not present

## 2023-03-19 DIAGNOSIS — R7401 Elevation of levels of liver transaminase levels: Secondary | ICD-10-CM | POA: Diagnosis not present

## 2023-05-10 ENCOUNTER — Other Ambulatory Visit (HOSPITAL_BASED_OUTPATIENT_CLINIC_OR_DEPARTMENT_OTHER): Payer: Self-pay

## 2023-05-10 DIAGNOSIS — M25562 Pain in left knee: Secondary | ICD-10-CM | POA: Diagnosis not present

## 2023-05-10 MED ORDER — DICLOFENAC SODIUM 75 MG PO TBEC
75.0000 mg | DELAYED_RELEASE_TABLET | Freq: Two times a day (BID) | ORAL | 0 refills | Status: AC
  Filled 2023-05-10: qty 20, 10d supply, fill #0

## 2023-06-04 ENCOUNTER — Other Ambulatory Visit (HOSPITAL_BASED_OUTPATIENT_CLINIC_OR_DEPARTMENT_OTHER): Payer: Self-pay

## 2023-06-04 DIAGNOSIS — M25562 Pain in left knee: Secondary | ICD-10-CM | POA: Diagnosis not present

## 2023-06-04 MED ORDER — PREDNISONE 20 MG PO TABS
20.0000 mg | ORAL_TABLET | Freq: Two times a day (BID) | ORAL | 0 refills | Status: AC
  Filled 2023-06-04: qty 10, 5d supply, fill #0

## 2023-06-22 DIAGNOSIS — M25562 Pain in left knee: Secondary | ICD-10-CM | POA: Diagnosis not present

## 2023-06-24 DIAGNOSIS — Z23 Encounter for immunization: Secondary | ICD-10-CM | POA: Diagnosis not present

## 2023-06-25 NOTE — Progress Notes (Unsigned)
 Cardiology Office Note:   Date:  06/28/2023  ID:  Philip Lamb, DOB [birth year], MRN [id number] PCP:  Lanae Pinal, MD  [facility] Providers Cardiologist:  Alyssa Backbone, MD Referring MD: Lanae Pinal, MD  Chief Complaint/Reason for Referral: Follow-up coronary artery calcification ASSESSMENT:    1. Coronary artery calcification seen on CAT scan   2. Hyperlipidemia LDL goal <70     PLAN:   In order of problems listed above: Coronary artery calcification: Continue aspirin  81 mg; given myalgias with rosuvastatin  will stop rosuvastatin  and start atorvastatin 20 mg.  If he has problems with atorvastatin will refer to pharmacy for further recommendations. Hyperlipidemia: Start atorvastatin 20 mg; check lipid panel and LFTs in 2 months.            Dispo:  Return in about 1 year (around 06/27/2024).      Medication Adjustments/Labs and Tests Ordered: Current medicines are reviewed at length with the patient today.  Concerns regarding medicines are outlined above.  The following changes have been made:     Labs/tests ordered: Orders Placed This Encounter  Procedures   Lipid panel   Hepatic function panel   EKG 12-Lead    Medication Changes: Meds ordered this encounter  Medications   atorvastatin (LIPITOR) 20 MG tablet    Sig: Take 1 tablet (20 mg total) by mouth daily.    Dispense:  90 tablet    Refill:  3    Stop rosuvastatin     Current medicines are reviewed at length with the patient today.  The patient does not have concerns regarding medicines.  I spent 33 minutes reviewing all clinical data during and prior to this visit including all relevant imaging studies, laboratories, clinical information from other health systems and prior notes from both Cardiology and other specialties, interviewing the patient, conducting a complete physical examination, and coordinating care in order to formulate a comprehensive and personalized evaluation and treatment  plan.   History of Present Illness:      FOCUSED PROBLEM LIST:   Coronary calcification Calcium  score CT 2023 Hyperlipidemia LP(a) 34.3 Rosuvastatin  positive >> myalgias  March 2023: The patient is a 58 y.o. male with the indicated medical history here for recommendations regarding elevated calcium  score on CT.  The patient was seen by his primary care provider recently and a CT scan was ordered for prognostic purposes.  The patient is doing well.  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  He is exercising regularly.  He denies any exertional angina, shortness of breath, presyncope, or syncope.  He did have limited palpitations about a month and a half ago when he heard the news about his CT scan but has had none since then.  He denies any severe bleeding or bruising.  He is required no hospitalizations or emergency room visits recently for any reason.   He does not smoke or drink currently.   He denies a family history of coronary artery disease in any first-degree family members.  Plan: Start aspirin  81 mg, increase Crestor  to 20 mg, check lipid panel, LFTs, LP(a).  May 2025:  Patient consents to use of AI scribe. In the interim the patient's LP(a) was not elevated.  LDL was 43 which is at goal.  She was seen in May 2024 and was doing well.  His blood pressure was well-controlled.  No changes were made to her medical regimen.  He has been doing well since his last visit in March 2023 with no  chest pain or shortness of breath. His blood pressure is satisfactory.  He is currently taking Crestor  20 mg daily for cholesterol management and aspirin . He experiences muscle aches, particularly in his legs, which occur often and sometimes when not exercising. The aches are noticeable when he gets up.  He recalls twisting his knee while using a treadmill, attributing it to overestimating his physical capabilities. Prior to the injury, he and his wife attended a fitness center three times a  week. He has not had any hospitalizations or emergency room visits recently, but he did visit an urgent care for the knee issue.          Current Medications: Current Meds  Medication Sig   aspirin  EC 81 MG tablet Take 1 tablet (81 mg total) by mouth daily. Swallow whole.   atorvastatin (LIPITOR) 20 MG tablet Take 1 tablet (20 mg total) by mouth daily.   azithromycin  (ZITHROMAX  Z-PAK) 250 MG tablet Take 2 tablets by mouth in a single dose on the 1st day, then take 1 tablet by mouth once a day on days 2-5.   Multiple Vitamin (MULTIVITAMIN) tablet Take 1 tablet by mouth daily.   Omega-3 Fatty Acids (FISH OIL) 500 MG CAPS Take by mouth.   predniSONE  (DELTASONE ) 20 MG tablet Take 1 tablet (20 mg total) by mouth 2 (two) times daily for 5 days.   typhoid (VIVOTIF ) DR capsule Take 1 capsule by mouth 1 hour before a meal for 4 doses, with cold water every other day, days 1,3,5,7 (7 days)   vitamin C (ASCORBIC ACID) 500 MG tablet Take 500 mg by mouth daily.   [DISCONTINUED] rosuvastatin  (CRESTOR ) 20 MG tablet Take 1 tablet (20 mg total) by mouth daily.     Review of Systems:   Please see the history of present illness.    All other systems reviewed and are negative.     EKGs/Labs/Other Test Reviewed:   EKG: 2024 Sinus rhythm  EKG Interpretation Date/Time:  Monday Jun 28 2023 10:03:59 EDT Ventricular Rate:  79 PR Interval:  172 QRS Duration:  88 QT Interval:  370 QTC Calculation: 424 R Axis:   41  Text Interpretation: Normal sinus rhythm Normal ECG No previous ECGs available Confirmed by Alyssa Backbone ([id number]) on 06/28/2023 10:08:28 AM         Risk Assessment/Calculations:          Physical Exam:   VS:  BP 126/82   Pulse 79   Ht 5\' 6"  (1.676 m)   Wt 145 lb (65.8 kg)   SpO2 98%   BMI 23.40 kg/m        Wt Readings from Last 3 Encounters:  06/28/23 145 lb (65.8 kg)  06/25/22 149 lb 9.6 oz (67.9 kg)  05/08/21 152 lb 3.2 oz (69 kg)      GENERAL:  No apparent distress,  AOx3 HEENT:  No carotid bruits, +2 carotid impulses, no scleral icterus CAR: RRR no murmurs, gallops, rubs, or thrills RES:  Clear to auscultation bilaterally ABD:  Soft, nontender, nondistended, positive bowel sounds x 4 VASC:  +2 radial pulses, +2 carotid pulses NEURO:  CN 2-12 grossly intact; motor and sensory grossly intact PSYCH:  No active depression or anxiety EXT:  No edema, ecchymosis, or cyanosis  Signed, Royalty Fakhouri K Harjit Leider, MD  06/28/2023 10:20 AM    [facility] [address] [place], [place], [place]  [address] Phone: [phone]; Fax: [phone]   Note:  This document was prepared  using Conservation officer, historic buildings and may include unintentional dictation errors.

## 2023-06-28 ENCOUNTER — Other Ambulatory Visit (HOSPITAL_BASED_OUTPATIENT_CLINIC_OR_DEPARTMENT_OTHER): Payer: Self-pay

## 2023-06-28 ENCOUNTER — Ambulatory Visit: Payer: 59 | Attending: Internal Medicine | Admitting: Internal Medicine

## 2023-06-28 ENCOUNTER — Encounter: Payer: Self-pay | Admitting: Internal Medicine

## 2023-06-28 VITALS — BP 126/82 | HR 79 | Ht 66.0 in | Wt 145.0 lb

## 2023-06-28 DIAGNOSIS — E785 Hyperlipidemia, unspecified: Secondary | ICD-10-CM

## 2023-06-28 DIAGNOSIS — I2510 Atherosclerotic heart disease of native coronary artery without angina pectoris: Secondary | ICD-10-CM

## 2023-06-28 MED ORDER — ATORVASTATIN CALCIUM 20 MG PO TABS
20.0000 mg | ORAL_TABLET | Freq: Every day | ORAL | 3 refills | Status: AC
  Filled 2023-06-28: qty 90, 90d supply, fill #0
  Filled 2023-09-30: qty 90, 90d supply, fill #1
  Filled 2024-01-10: qty 90, 90d supply, fill #2

## 2023-06-28 NOTE — Patient Instructions (Signed)
 Medication Instructions:  1.) stop rosuvastatin  2.) start atorvastatin 20 mg - one tablet daily *If you need a refill on your cardiac medications before your next appointment, please call your pharmacy*  Lab Work: Any [facility] for blood work in about 8 weeks - lipids, liver function   Testing/Procedures: none  Follow-Up: At [facility], you and your health needs are our priority.  As part of our continuing mission to provide you with exceptional heart care, our providers are all part of one team.  This team includes your primary Cardiologist (physician) and Advanced Practice Providers or APPs (Physician Assistants and Nurse Practitioners) who all work together to provide you with the care you need, when you need it.  Your next appointment:   12 month(s)  Provider:   One of our Advanced Practice Providers (APPs): Melita Springer, PA-C  Friddie Jetty, NP Evaline Hill, NP  Theotis Flake, PA-C Lawana Pray, NP  Willis Harter, PA-C Lovette Rud, PA-C  [place], PA-C Ernest Dick, NP  Marlana Silvan, NP Marcie Sever, PA-C  Laquita Plant, PA-C    Dayna Dunn, PA-C  Scott Weaver, PA-C Palmer Bobo, NP Katlyn West, NP Callie Goodrich, PA-C  Evan Williams, PA-C Sheng Haley, PA-C  Xika Zhao, NP Kathleen Johnson, PA-C    We recommend signing up for the patient portal called "MyChart".  Sign up information is provided on this After Visit Summary.  MyChart is used to connect with patients for Virtual Visits (Telemedicine).  Patients are able to view lab/test results, encounter notes, upcoming appointments, etc.  Non-urgent messages can be sent to your provider as well.   To learn more about what you can do with MyChart, go to [email or web address].

## 2023-09-06 ENCOUNTER — Other Ambulatory Visit (HOSPITAL_BASED_OUTPATIENT_CLINIC_OR_DEPARTMENT_OTHER): Payer: Self-pay

## 2023-09-06 DIAGNOSIS — R062 Wheezing: Secondary | ICD-10-CM | POA: Diagnosis not present

## 2023-09-06 DIAGNOSIS — J40 Bronchitis, not specified as acute or chronic: Secondary | ICD-10-CM | POA: Diagnosis not present

## 2023-09-06 DIAGNOSIS — R051 Acute cough: Secondary | ICD-10-CM | POA: Diagnosis not present

## 2023-09-06 MED ORDER — PROMETHAZINE-DM 6.25-15 MG/5ML PO SYRP
5.0000 mL | ORAL_SOLUTION | Freq: Every day | ORAL | 0 refills | Status: AC
  Filled 2023-09-06: qty 50, 10d supply, fill #0

## 2023-09-06 MED ORDER — DOXYCYCLINE HYCLATE 100 MG PO CAPS
100.0000 mg | ORAL_CAPSULE | Freq: Two times a day (BID) | ORAL | 0 refills | Status: AC
  Filled 2023-09-06: qty 14, 7d supply, fill #0

## 2023-09-06 MED ORDER — ALBUTEROL SULFATE HFA 108 (90 BASE) MCG/ACT IN AERS
1.0000 | INHALATION_SPRAY | RESPIRATORY_TRACT | 0 refills | Status: AC | PRN
  Filled 2023-09-06: qty 6.7, 30d supply, fill #0

## 2023-09-07 ENCOUNTER — Other Ambulatory Visit (HOSPITAL_BASED_OUTPATIENT_CLINIC_OR_DEPARTMENT_OTHER): Payer: Self-pay

## 2023-09-07 DIAGNOSIS — Z0000 Encounter for general adult medical examination without abnormal findings: Secondary | ICD-10-CM | POA: Diagnosis not present

## 2023-09-07 MED ORDER — BENZONATATE 200 MG PO CAPS
200.0000 mg | ORAL_CAPSULE | Freq: Three times a day (TID) | ORAL | 0 refills | Status: AC | PRN
  Filled 2023-09-07: qty 21, 7d supply, fill #0

## 2023-09-08 LAB — LAB REPORT - SCANNED
EGFR (Non-African Amer.): 101
PSA, Total: 4.12

## 2023-11-11 ENCOUNTER — Ambulatory Visit: Admitting: Family Medicine

## 2023-12-30 DIAGNOSIS — R972 Elevated prostate specific antigen [PSA]: Secondary | ICD-10-CM | POA: Diagnosis not present

## 2024-01-03 ENCOUNTER — Other Ambulatory Visit: Payer: Self-pay | Admitting: Urology

## 2024-01-03 ENCOUNTER — Encounter: Payer: Self-pay | Admitting: Urology

## 2024-01-03 DIAGNOSIS — R972 Elevated prostate specific antigen [PSA]: Secondary | ICD-10-CM

## 2024-01-06 ENCOUNTER — Encounter: Payer: Self-pay | Admitting: Urology

## 2024-01-17 ENCOUNTER — Other Ambulatory Visit: Payer: Self-pay | Admitting: Urology

## 2024-02-07 ENCOUNTER — Inpatient Hospital Stay: Admission: RE | Admit: 2024-02-07 | Discharge: 2024-02-07 | Attending: Urology

## 2024-02-07 DIAGNOSIS — R972 Elevated prostate specific antigen [PSA]: Secondary | ICD-10-CM

## 2024-02-07 MED ORDER — GADOPICLENOL 0.5 MMOL/ML IV SOLN
7.5000 mL | Freq: Once | INTRAVENOUS | Status: AC | PRN
  Administered 2024-02-07: 12:00:00 7.5 mL via INTRAVENOUS
